# Patient Record
Sex: Female | Born: 2005
Health system: Southern US, Community
[De-identification: ages and names within clinical notes are randomized; demographics above are authoritative.]

## PROBLEM LIST (undated history)

## (undated) DIAGNOSIS — R636 Underweight: Secondary | ICD-10-CM

## (undated) DIAGNOSIS — F988 Other specified behavioral and emotional disorders with onset usually occurring in childhood and adolescence: Secondary | ICD-10-CM

## (undated) HISTORY — DX: Underweight: R63.6

## (undated) HISTORY — DX: Other specified behavioral and emotional disorders with onset usually occurring in childhood and adolescence: F98.8

## (undated) HISTORY — PX: MULTIPLE TOOTH EXTRACTIONS: SHX2053

---
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
## 2023-04-02 ENCOUNTER — Other Ambulatory Visit (HOSPITAL_BASED_OUTPATIENT_CLINIC_OR_DEPARTMENT_OTHER): Payer: Self-pay | Admitting: Obstetrics & Gynecology

## 2023-04-02 DIAGNOSIS — N926 Irregular menstruation, unspecified: Secondary | ICD-10-CM

## 2023-05-31 ENCOUNTER — Other Ambulatory Visit (HOSPITAL_BASED_OUTPATIENT_CLINIC_OR_DEPARTMENT_OTHER): Payer: Self-pay

## 2023-06-11 DIAGNOSIS — Z713 Dietary counseling and surveillance: Secondary | ICD-10-CM | POA: Diagnosis not present

## 2023-06-11 DIAGNOSIS — Z0001 Encounter for general adult medical examination with abnormal findings: Secondary | ICD-10-CM | POA: Diagnosis not present

## 2023-06-11 DIAGNOSIS — Z68.41 Body mass index (BMI) pediatric, 5th percentile to less than 85th percentile for age: Secondary | ICD-10-CM | POA: Diagnosis not present

## 2023-06-11 DIAGNOSIS — Z0000 Encounter for general adult medical examination without abnormal findings: Secondary | ICD-10-CM | POA: Diagnosis not present

## 2023-06-11 DIAGNOSIS — Z1331 Encounter for screening for depression: Secondary | ICD-10-CM | POA: Diagnosis not present

## 2023-06-11 DIAGNOSIS — Z113 Encounter for screening for infections with a predominantly sexual mode of transmission: Secondary | ICD-10-CM | POA: Diagnosis not present

## 2023-06-13 ENCOUNTER — Telehealth (INDEPENDENT_AMBULATORY_CARE_PROVIDER_SITE_OTHER): Payer: BC Managed Care – PPO | Admitting: Medical

## 2023-06-13 ENCOUNTER — Encounter (HOSPITAL_BASED_OUTPATIENT_CLINIC_OR_DEPARTMENT_OTHER): Payer: Self-pay | Admitting: Medical

## 2023-06-13 VITALS — BP 101/61 | HR 79 | Ht 62.0 in | Wt 109.0 lb

## 2023-06-13 DIAGNOSIS — Z3009 Encounter for other general counseling and advice on contraception: Secondary | ICD-10-CM

## 2023-06-13 MED ORDER — NORETHIN ACE-ETH ESTRAD-FE 1-20 MG-MCG(24) PO TABS: 1.0000 | ORAL_TABLET | Freq: Every day | ORAL | 11 refills | Status: DC

## 2023-06-13 NOTE — Progress Notes (Signed)
I connected with Stacey Cameron on 06/13/23 at 2:15 PM by: MyChart video and verified that I am speaking with the correct person using two identifiers.  Patient is located at home and provider is located at [facility].     The purpose of this virtual visit is to provide medical care while limiting exposure to the novel coronavirus. I discussed the limitations, risks, security and privacy concerns of performing an evaluation and management service by MyChart video and the availability of in person appointments. I also discussed with the patient that there may be a patient responsible charge related to this service. By engaging in this virtual visit, you consent to the provision of healthcare.  Additionally, you authorize for your insurance to be billed for the services provided during this visit.  The patient expressed understanding and agreed to proceed.   History:  Stacey Cameron is a 17 y.o. G0P0000 is being seen virtually today requesting to change her birth control. The patient was on OCPs for birth control initially and struggled to take them on schedule, she changed to the patch and has noted significant irritation at the site of administration and rash. She would like to change back to OCPs. She tolerated the OCPs well. LMP 05/07/23, she is mid-cycle on the patch.   The following portions of the patient's history were reviewed and updated as appropriate: allergies, current medications, family history, past medical history, social history, past surgical history and problem list.  Review of Systems:  Review of Systems  Gastrointestinal:  Negative for abdominal pain.  Genitourinary:        Neg  vaginal bleeding      Objective:  Physical Exam BP 101/61 (BP Location: Left Arm, Patient Position: Sitting, Cuff Size: Normal)   Pulse 79   Ht 5\' 2"  (1.575 m) Comment: Reported  Wt 109 lb (49.4 kg) Comment: Reported  LMP 05/30/2023 (Approximate)   BMI 19.94 kg/m  Physical Exam Constitutional:       General: She is not in acute distress.    Appearance: Normal appearance. She is normal weight.  Pulmonary:     Effort: Pulmonary effort is normal.  Skin:    General: Skin is warm and dry.     Coloration: Skin is not pale.  Neurological:     Mental Status: She is alert and oriented to person, place, and time.  Psychiatric:        Mood and Affect: Mood normal.     Health Maintenance Due  Topic Date Due   DTaP/Tdap/Td (1 - Tdap) Never done   HPV VACCINES (1 - 3-dose series) Never done   HIV Screening  Never done   COVID-19 Vaccine (1 - 2023-24 season) Never done   Hepatitis C Screening  Never done   INFLUENZA VACCINE  06/06/2023     Assessment & Plan:  1. Unwanted fertility - Patient counseled to start OCPs on the first Sunday following her next period to avoid breakthrough bleeding or lapse in coverage - Norethindrone Acetate-Ethinyl Estrad-FE (LOESTRIN 24 FE) 1-20 MG-MCG(24) tablet; Take 1 tablet by mouth daily.  Dispense: 28 tablet; Refill: 11 - Patient may call the office if a 90 day supply is better since she is away at college, patient and mother counseled that Rx can be transferred through [facility] if needed to one local to her college as well.    Return in about 1 year (around 06/12/2024) for Annual exam.  Marny Lowenstein, PA-C 06/13/2023 2:15 PM

## 2023-07-25 ENCOUNTER — Other Ambulatory Visit (HOSPITAL_BASED_OUTPATIENT_CLINIC_OR_DEPARTMENT_OTHER): Payer: Self-pay

## 2023-07-26 ENCOUNTER — Other Ambulatory Visit (HOSPITAL_BASED_OUTPATIENT_CLINIC_OR_DEPARTMENT_OTHER): Payer: Self-pay

## 2023-07-26 MED ORDER — AMPHETAMINE-DEXTROAMPHET ER 25 MG PO CP24
25.0000 mg | ORAL_CAPSULE | Freq: Every day | ORAL | 0 refills | Status: DC
  Filled 2023-07-26: qty 30, 30d supply, fill #0

## 2023-08-03 ENCOUNTER — Other Ambulatory Visit (HOSPITAL_BASED_OUTPATIENT_CLINIC_OR_DEPARTMENT_OTHER): Payer: Self-pay | Admitting: Obstetrics & Gynecology

## 2023-08-03 DIAGNOSIS — N926 Irregular menstruation, unspecified: Secondary | ICD-10-CM

## 2023-09-11 ENCOUNTER — Other Ambulatory Visit: Payer: Self-pay

## 2023-09-11 ENCOUNTER — Other Ambulatory Visit (HOSPITAL_BASED_OUTPATIENT_CLINIC_OR_DEPARTMENT_OTHER): Payer: Self-pay

## 2023-09-11 MED ORDER — AMPHETAMINE-DEXTROAMPHET ER 25 MG PO CP24
25.0000 mg | ORAL_CAPSULE | Freq: Every day | ORAL | 0 refills | Status: DC
Start: 2023-09-11 — End: 2023-09-30
  Filled 2023-09-11: qty 30, 30d supply, fill #0

## 2023-09-11 MED ORDER — AMPHETAMINE-DEXTROAMPHETAMINE 5 MG PO TABS
5.0000 mg | ORAL_TABLET | Freq: Every day | ORAL | 0 refills | Status: DC
Start: 2023-09-11 — End: 2023-09-30
  Filled 2023-09-11: qty 30, 30d supply, fill #0

## 2023-09-12 ENCOUNTER — Other Ambulatory Visit (HOSPITAL_BASED_OUTPATIENT_CLINIC_OR_DEPARTMENT_OTHER): Payer: Self-pay

## 2023-09-30 ENCOUNTER — Other Ambulatory Visit (HOSPITAL_BASED_OUTPATIENT_CLINIC_OR_DEPARTMENT_OTHER): Payer: Self-pay

## 2023-09-30 DIAGNOSIS — F902 Attention-deficit hyperactivity disorder, combined type: Secondary | ICD-10-CM | POA: Diagnosis not present

## 2023-09-30 MED ORDER — AMPHETAMINE-DEXTROAMPHET ER 25 MG PO CP24
25.0000 mg | ORAL_CAPSULE | Freq: Every day | ORAL | 0 refills | Status: DC
Start: 2023-09-30 — End: 2024-05-22
  Filled 2023-09-30 – 2023-11-14 (×3): qty 30, 30d supply, fill #0

## 2023-09-30 MED ORDER — AMPHETAMINE-DEXTROAMPHETAMINE 5 MG PO TABS
5.0000 mg | ORAL_TABLET | Freq: Every day | ORAL | 0 refills | Status: DC
  Filled 2023-09-30 – 2023-11-14 (×3): qty 30, 30d supply, fill #0

## 2023-10-25 ENCOUNTER — Other Ambulatory Visit (HOSPITAL_BASED_OUTPATIENT_CLINIC_OR_DEPARTMENT_OTHER): Payer: Self-pay | Admitting: Certified Nurse Midwife

## 2023-10-25 ENCOUNTER — Telehealth (HOSPITAL_BASED_OUTPATIENT_CLINIC_OR_DEPARTMENT_OTHER): Payer: Self-pay | Admitting: Certified Nurse Midwife

## 2023-10-25 ENCOUNTER — Ambulatory Visit (HOSPITAL_BASED_OUTPATIENT_CLINIC_OR_DEPARTMENT_OTHER): Payer: Self-pay | Admitting: Certified Nurse Midwife

## 2023-10-25 DIAGNOSIS — Z3009 Encounter for other general counseling and advice on contraception: Secondary | ICD-10-CM

## 2023-10-25 MED ORDER — NORETHIN ACE-ETH ESTRAD-FE 1-20 MG-MCG(24) PO TABS
1.0000 | ORAL_TABLET | Freq: Every day | ORAL | 11 refills | Status: DC
Start: 2023-10-25 — End: 2024-08-28

## 2023-10-25 NOTE — Telephone Encounter (Signed)
Called patient and  left  a message to call the office to rescheduled missed appointment.

## 2023-11-14 ENCOUNTER — Other Ambulatory Visit (HOSPITAL_BASED_OUTPATIENT_CLINIC_OR_DEPARTMENT_OTHER): Payer: Self-pay

## 2023-11-14 MED ORDER — AMPHETAMINE-DEXTROAMPHET ER 25 MG PO CP24
25.0000 mg | ORAL_CAPSULE | Freq: Every day | ORAL | 0 refills | Status: DC
  Filled 2023-12-30: qty 30, 30d supply, fill #0

## 2023-11-14 MED ORDER — AMPHETAMINE-DEXTROAMPHETAMINE 5 MG PO TABS: 5.0000 mg | ORAL_TABLET | Freq: Every day | ORAL | 0 refills | Status: DC

## 2023-11-15 ENCOUNTER — Other Ambulatory Visit: Payer: Self-pay

## 2023-12-30 ENCOUNTER — Other Ambulatory Visit (HOSPITAL_BASED_OUTPATIENT_CLINIC_OR_DEPARTMENT_OTHER): Payer: Self-pay

## 2024-05-22 ENCOUNTER — Encounter: Payer: Self-pay | Admitting: Family

## 2024-05-22 ENCOUNTER — Ambulatory Visit (INDEPENDENT_AMBULATORY_CARE_PROVIDER_SITE_OTHER): Admitting: Family

## 2024-05-22 ENCOUNTER — Other Ambulatory Visit (HOSPITAL_BASED_OUTPATIENT_CLINIC_OR_DEPARTMENT_OTHER): Payer: Self-pay

## 2024-05-22 ENCOUNTER — Telehealth: Payer: Self-pay

## 2024-05-22 VITALS — BP 106/66 | HR 89 | Temp 98.1°F | Ht 62.0 in | Wt 115.5 lb

## 2024-05-22 DIAGNOSIS — F900 Attention-deficit hyperactivity disorder, predominantly inattentive type: Secondary | ICD-10-CM

## 2024-05-22 MED ORDER — AMPHETAMINE-DEXTROAMPHET ER 25 MG PO CP24: 25.0000 mg | ORAL_CAPSULE | Freq: Every morning | ORAL | 0 refills | Status: DC

## 2024-05-22 MED ORDER — AMPHETAMINE-DEXTROAMPHETAMINE 5 MG PO TABS: 5.0000 mg | ORAL_TABLET | Freq: Every day | ORAL | 0 refills | Status: AC

## 2024-05-22 MED ORDER — AMPHETAMINE-DEXTROAMPHETAMINE 5 MG PO TABS
5.0000 mg | ORAL_TABLET | Freq: Every day | ORAL | 0 refills | Status: DC
  Filled 2024-05-22: qty 30, 30d supply, fill #0

## 2024-05-22 MED ORDER — AMPHETAMINE-DEXTROAMPHETAMINE 5 MG PO TABS
5.0000 mg | ORAL_TABLET | Freq: Every day | ORAL | 0 refills | Status: DC
  Filled 2024-06-30 (×2): qty 30, 30d supply, fill #0

## 2024-05-22 MED ORDER — AMPHETAMINE-DEXTROAMPHET ER 25 MG PO CP24
25.0000 mg | ORAL_CAPSULE | ORAL | 0 refills | Status: DC
  Filled 2024-05-22: qty 30, 30d supply, fill #0

## 2024-05-22 MED ORDER — AMPHETAMINE-DEXTROAMPHETAMINE 5 MG PO TABS: 5.0000 mg | ORAL_TABLET | Freq: Every day | ORAL | 0 refills | Status: DC

## 2024-05-22 MED ORDER — AMPHETAMINE-DEXTROAMPHET ER 25 MG PO CP24
25.0000 mg | ORAL_CAPSULE | Freq: Every morning | ORAL | 0 refills | Status: DC
  Filled 2024-06-30: qty 30, 30d supply, fill #0

## 2024-05-22 MED ORDER — AMPHETAMINE-DEXTROAMPHET ER 25 MG PO CP24
25.0000 mg | ORAL_CAPSULE | ORAL | 0 refills | Status: DC
Start: 2024-05-22 — End: 2024-05-22

## 2024-05-22 NOTE — Telephone Encounter (Signed)
 Copied from CRM [id number]. Topic: Clinical - Prescription Issue >> May 22, 2024  9:19 AM Viola FALCON wrote: Reason for CRM: Patient was seen today - mother Kate call and needs both Adderall prescriptions resent to the [facility] [address] [place] [address]

## 2024-05-22 NOTE — Telephone Encounter (Signed)
 done

## 2024-05-22 NOTE — Telephone Encounter (Signed)
 I called pt and LVM in regards.

## 2024-05-22 NOTE — Patient Instructions (Addendum)
 Welcome to [facility], It was a pleasure meeting you today!    As discussed, I have sent your refills to your pharmacy.  Please schedule a 3 month follow up visit today, ok if virtual.    PLEASE NOTE: If you had any LAB tests please let us  know if you have not heard back within a few days. You may see your results on MyChart before we have a chance to review them but we will give you a call once they are reviewed by us . If we ordered any REFERRALS today, please let us  know if you have not heard from their office within the next week.  Let us  know through MyChart if you are needing REFILLS, or have your pharmacy send us  the request. You can also use MyChart to communicate with me or any office staff.  Please try these tips to maintain a healthy lifestyle: It is important that you exercise regularly at least 30 minutes 5 times a week. Think about what you will eat, plan ahead. Choose whole foods, & think  clean, green, fresh or frozen over canned, processed or packaged foods which are more sugary, salty, and fatty. 70 to 75% of food eaten should be fresh vegetables and protein. 2-3  meals daily with healthy snacks between meals, but must be whole fruit, protein or vegetables. Aim to eat over a 10 hour period when you are active, for example, 7am to 5pm, and then STOP after your last meal of the day, drinking only water.  Shorter eating windows, 6-8 hours, are showing benefits in heart disease and blood sugar regulation. Drink water every day! Shoot for 64 ounces daily = 8 cups, no other drink is as healthy! Fruit juice is best enjoyed in a healthy way, by EATING the fruit.

## 2024-05-22 NOTE — Assessment & Plan Note (Signed)
 Taking Adderall XR 25mg  qam and 5mg  IR dose after lunch. Has been on this regimen since middle school. Denies any SE and dose if working well for her. - Sending refills of both meds for 30 pills x 30d x 3 mos - Follow up in 3 mos, ok if virtual

## 2024-05-22 NOTE — Addendum Note (Signed)
 Addended by: Miski Feldpausch on: 05/22/2024 03:48 PM   Modules accepted: Orders

## 2024-05-22 NOTE — Progress Notes (Signed)
 New Patient Office Visit  Subjective:  Patient ID: Stacey Cameron, female    DOB: September 26, 2006  Age: 18 y.o. MRN: [id number]  CC:  Chief Complaint  Patient presents with   New Patient (Initial Visit)   ADHD   HPI Stacey Cameron presents for establishing care today.  ADHD f/u: Medications helping target goals: generic Adderall XR 25mg  and 5mg  IR Regimen: daily Medication side effects/concerns:  none Weight: wnl Sleep: no issues Mood changes: none Tics: denies Blood pressure, Weight, Pulse, Behavior reviewed: wnl      Assessment & Plan:  Attention deficit hyperactivity disorder (ADHD), predominantly inattentive type Assessment & Plan: Taking Adderall XR 25mg  qam and 5mg  IR dose after lunch. Has been on this regimen since middle school. Denies any SE and dose if working well for her. - Sending refills of both meds for 30 pills x 30d x 3 mos - Follow up in 3 mos, ok if virtual  Orders: -     Amphetamine -Dextroamphet ER; Take 1 capsule by mouth every morning.  Dispense: 30 capsule; Refill: 0 -     Amphetamine -Dextroamphet ER; Take 1 capsule by mouth every morning.  Dispense: 30 capsule; Refill: 0 -     Amphetamine -Dextroamphet ER; Take 1 capsule by mouth every morning.  Dispense: 30 capsule; Refill: 0 -     Amphetamine -Dextroamphetamine ; Take 1 tablet (5 mg total) by mouth daily after lunch.  Dispense: 30 tablet; Refill: 0 -     Amphetamine -Dextroamphetamine ; Take 1 tablet (5 mg total) by mouth daily after lunch.  Dispense: 30 tablet; Refill: 0 -     Amphetamine -Dextroamphetamine ; Take 1 tablet (5 mg total) by mouth daily after lunch.  Dispense: 30 tablet; Refill: 0    Subjective:    Outpatient Medications Prior to Visit  Medication Sig Dispense Refill   amphetamine -dextroamphetamine  (ADDERALL) 5 MG tablet Take 1 tablet (5 mg total) by mouth daily in the afternoon if needed 30 tablet 0   amphetamine -dextroamphetamine  (ADDERALL) 5 MG tablet Take 1 tablet (5 mg total) by mouth  daily in the afternoon if needed. 30 tablet 0   Norethindrone Acetate-Ethinyl Estrad-FE (LOESTRIN 24 FE) 1-20 MG-MCG(24) tablet Take 1 tablet by mouth daily. 90 tablet 11   amphetamine -dextroamphetamine  (ADDERALL XR) 20 MG 24 hr capsule Take 20 mg by mouth daily.     amphetamine -dextroamphetamine  (ADDERALL XR) 25 MG 24 hr capsule Take 1 capsule by mouth daily. 30 capsule 0   amphetamine -dextroamphetamine  (ADDERALL XR) 25 MG 24 hr capsule Take 1 capsule by mouth daily. 30 capsule 0   cyproheptadine (PERIACTIN) 2 MG/5ML syrup TAKE 10 MLS BY MOUTH DAILY (Patient not taking: Reported on 05/22/2024) 600 mL 3   norelgestromin -ethinyl estradiol  (XULANE) 150-35 MCG/24HR transdermal patch APPLY 1 PATCH ONCE A WEEK (Patient not taking: Reported on 05/22/2024) 9 patch 1   No facility-administered medications prior to visit.   Past Medical History:  Diagnosis Date   ADD (attention deficit disorder)    Underweight    Past Surgical History:  Procedure Laterality Date   MULTIPLE TOOTH EXTRACTIONS      Objective:   Today's Vitals: BP 106/66 (BP Location: Left Arm, Patient Position: Sitting, Cuff Size: Large)   Pulse 89   Temp 98.1 F (36.7 C) (Temporal)   Ht 5' 2 (1.575 m)   Wt 115 lb 8 oz (52.4 kg)   LMP 04/12/2024 (Exact Date)   BMI 21.13 kg/m   Physical Exam Vitals and nursing note reviewed.  Constitutional:  Appearance: Normal appearance.  Cardiovascular:     Rate and Rhythm: Normal rate and regular rhythm.  Pulmonary:     Effort: Pulmonary effort is normal.     Breath sounds: Normal breath sounds.  Musculoskeletal:        General: Normal range of motion.  Skin:    General: Skin is warm and dry.  Neurological:     Mental Status: She is alert.  Psychiatric:        Mood and Affect: Mood normal.        Behavior: Behavior normal.     Meds ordered this encounter  Medications   amphetamine -dextroamphetamine  (ADDERALL XR) 25 MG 24 hr capsule    Sig: Take 1 capsule by mouth  every morning.    Dispense:  30 capsule    Refill:  0    Dec RX   amphetamine -dextroamphetamine  (ADDERALL XR) 25 MG 24 hr capsule    Sig: Take 1 capsule by mouth every morning.    Dispense:  30 capsule    Refill:  0   amphetamine -dextroamphetamine  (ADDERALL XR) 25 MG 24 hr capsule    Sig: Take 1 capsule by mouth every morning.    Dispense:  30 capsule    Refill:  0    Supervising Provider:   ANDY, CAMILLE L [2031]   amphetamine -dextroamphetamine  (ADDERALL) 5 MG tablet    Sig: Take 1 tablet (5 mg total) by mouth daily after lunch.    Dispense:  30 tablet    Refill:  0    Supervising Provider:   ANDY, CAMILLE L [2031]   amphetamine -dextroamphetamine  (ADDERALL) 5 MG tablet    Sig: Take 1 tablet (5 mg total) by mouth daily after lunch.    Dispense:  30 tablet    Refill:  0    Supervising Provider:   ANDY, CAMILLE L [2031]   amphetamine -dextroamphetamine  (ADDERALL) 5 MG tablet    Sig: Take 1 tablet (5 mg total) by mouth daily after lunch.    Dispense:  30 tablet    Refill:  0    Supervising Provider:   ANDY, CAMILLE L [2031]    Lucius Krabbe, NP

## 2024-05-25 ENCOUNTER — Other Ambulatory Visit (HOSPITAL_BASED_OUTPATIENT_CLINIC_OR_DEPARTMENT_OTHER): Payer: Self-pay

## 2024-06-30 ENCOUNTER — Other Ambulatory Visit (HOSPITAL_BASED_OUTPATIENT_CLINIC_OR_DEPARTMENT_OTHER): Payer: Self-pay

## 2024-08-18 ENCOUNTER — Other Ambulatory Visit (HOSPITAL_BASED_OUTPATIENT_CLINIC_OR_DEPARTMENT_OTHER): Payer: Self-pay

## 2024-08-28 ENCOUNTER — Telehealth: Admitting: Family

## 2024-08-28 ENCOUNTER — Encounter: Payer: Self-pay | Admitting: Family

## 2024-08-28 ENCOUNTER — Other Ambulatory Visit (HOSPITAL_BASED_OUTPATIENT_CLINIC_OR_DEPARTMENT_OTHER): Payer: Self-pay

## 2024-08-28 DIAGNOSIS — N926 Irregular menstruation, unspecified: Secondary | ICD-10-CM | POA: Diagnosis not present

## 2024-08-28 DIAGNOSIS — F900 Attention-deficit hyperactivity disorder, predominantly inattentive type: Secondary | ICD-10-CM

## 2024-08-28 DIAGNOSIS — Z3041 Encounter for surveillance of contraceptive pills: Secondary | ICD-10-CM | POA: Diagnosis not present

## 2024-08-28 MED ORDER — AMPHETAMINE-DEXTROAMPHETAMINE 5 MG PO TABS: 5.0000 mg | ORAL_TABLET | Freq: Every day | ORAL | 0 refills | Status: AC

## 2024-08-28 MED ORDER — AMPHETAMINE-DEXTROAMPHET ER 25 MG PO CP24: 25.0000 mg | ORAL_CAPSULE | ORAL | 0 refills | Status: AC

## 2024-08-28 MED ORDER — AMPHETAMINE-DEXTROAMPHET ER 25 MG PO CP24
25.0000 mg | ORAL_CAPSULE | Freq: Every morning | ORAL | 0 refills | Status: AC
  Filled 2024-08-28 – 2024-09-08 (×2): qty 30, 30d supply, fill #0

## 2024-08-28 MED ORDER — AMPHETAMINE-DEXTROAMPHETAMINE 5 MG PO TABS
5.0000 mg | ORAL_TABLET | Freq: Every day | ORAL | 0 refills | Status: AC
  Filled 2024-08-28: qty 30, 30d supply, fill #0

## 2024-08-28 MED ORDER — AMPHETAMINE-DEXTROAMPHET ER 25 MG PO CP24: 25.0000 mg | ORAL_CAPSULE | Freq: Every morning | ORAL | 0 refills | Status: AC

## 2024-08-28 MED ORDER — HAILEY 24 FE 1-20 MG-MCG(24) PO TABS
1.0000 | ORAL_TABLET | Freq: Every day | ORAL | 3 refills | Status: AC
  Filled 2024-08-28 – 2024-09-08 (×2): qty 84, 84d supply, fill #0

## 2024-08-28 NOTE — Progress Notes (Signed)
 MyChart Video Visit    Virtual Visit via Video Note   This format is felt to be most appropriate for this patient at this time. Physical exam was limited by quality of the video and audio technology used for the visit. CMA was able to get the patient set up on a video visit.  Patient location: Home. Patient and provider in visit Provider location: Office  I discussed the limitations of evaluation and management by telemedicine and the availability of in person appointments. The patient expressed understanding and agreed to proceed.  Visit Date: 08/28/2024  Today's healthcare provider: Lucius Krabbe, NP     Subjective:   Patient ID: Stacey Cameron, female    DOB: [birth year], 18 y.o.   MRN: [id number]  Chief Complaint  Patient presents with   ADHD  Discussed the use of AI scribe software for clinical note transcription with the patient, who gave verbal consent to proceed.  History of Present Illness Stacey Cameron is a 18 year old female with ADHD who presents for a follow-up visit.  She takes Adderall 25 mg extended release every morning and 5 mg immediate release in the afternoons, though she occasionally skips the afternoon dose and sometimes the extended release on weekends. She experiences no side effects such as tics, anxiety, or increased heart rate. Her sleep and eating habits are stable. Her blood pressure, measured by her roommate, was initially 98/52 mmHg with crossed legs, later 112/70 mmHg. She experiences no palpitations or unusual anxiety. She takes Darryle with iron daily for birth control and requests a refill.  Assessment & Plan Attention-deficit hyperactivity disorder, predominantly inattentive type ADHD managed with Adderall. No side effects reported. Sleeping and eating well. - Continue Adderall 25 mg extended release every morning. - Continue Adderall 5 mg immediate release in the afternoons as needed. - Monitor blood pressure and heart rate regularly. -  Instruct to report palpitations, racing heart, or increased anxiety. - Send 30 day pills with 3 refills to pharmacy. - Schedule follow-up in 3-4 months in the office or when refills needed.  Encounter for birth control maintenance Taking Darryle with iron daily. No interactions with Adderall except potential increased blood pressure and heart rate. - Send refill for [facility] with iron to pharmacy for 90d w/3 RF   Past Medical History:  Diagnosis Date   ADD (attention deficit disorder)    Underweight     Past Surgical History:  Procedure Laterality Date   MULTIPLE TOOTH EXTRACTIONS      Outpatient Medications Prior to Visit  Medication Sig Dispense Refill   amphetamine -dextroamphetamine  (ADDERALL XR) 25 MG 24 hr capsule Take 1 capsule by mouth every morning. 30 capsule 0   amphetamine -dextroamphetamine  (ADDERALL XR) 25 MG 24 hr capsule Take 1 capsule by mouth every morning. 30 capsule 0   amphetamine -dextroamphetamine  (ADDERALL XR) 25 MG 24 hr capsule Take 1 capsule by mouth every morning. 30 capsule 0   amphetamine -dextroamphetamine  (ADDERALL) 5 MG tablet Take 1 tablet (5 mg total) by mouth daily in the afternoon if needed 30 tablet 0   amphetamine -dextroamphetamine  (ADDERALL) 5 MG tablet Take 1 tablet (5 mg total) by mouth daily in the afternoon if needed. 30 tablet 0   amphetamine -dextroamphetamine  (ADDERALL) 5 MG tablet Take 1 tablet (5 mg total) by mouth daily after lunch. 30 tablet 0   amphetamine -dextroamphetamine  (ADDERALL) 5 MG tablet Take 1 tablet (5 mg total) by mouth daily after lunch. 30 tablet 0   amphetamine -dextroamphetamine  (ADDERALL) 5 MG tablet  Take 1 tablet (5 mg total) by mouth daily after lunch. 30 tablet 0   Norethindrone Acetate-Ethinyl Estrad-FE (LOESTRIN 24 FE) 1-20 MG-MCG(24) tablet Take 1 tablet by mouth daily. 90 tablet 11   No facility-administered medications prior to visit.    Allergies  Allergen Reactions   Other Anaphylaxis    cats        Objective:   Physical Exam Vitals and nursing note reviewed.  Constitutional:      General: Pt is not in acute distress.    Appearance: Normal appearance.  HENT:     Head: Normocephalic.  Pulmonary:     Effort: No respiratory distress.  Musculoskeletal:     Cervical back: Normal range of motion.  Skin:    General: Skin is dry.     Coloration: Skin is not pale.  Neurological:     Mental Status: Pt is alert and oriented to person, place, and time.  Psychiatric:        Mood and Affect: Mood normal.   There were no vitals taken for this visit.  Wt Readings from Last 3 Encounters:  05/22/24 115 lb 8 oz (52.4 kg) (25%, Z= -0.67)*  06/13/23 109 lb (49.4 kg) (16%, Z= -1.01)*  09/26/22 107 lb (48.5 kg) (14%, Z= -1.06)*   * Growth percentiles are based on CDC (Girls, 2-20 Years) data.     I discussed the assessment and treatment plan with the patient. The patient was provided an opportunity to ask questions and all were answered. The patient agreed with the plan and demonstrated an understanding of the instructions.   The patient was advised to call back or seek an in-person evaluation if the symptoms worsen or if the condition fails to improve as anticipated.  Lucius Krabbe, NP [facility] [phone] (phone) [phone] (fax)  [facility]

## 2024-08-29 ENCOUNTER — Other Ambulatory Visit (HOSPITAL_BASED_OUTPATIENT_CLINIC_OR_DEPARTMENT_OTHER): Payer: Self-pay

## 2024-09-07 ENCOUNTER — Other Ambulatory Visit (HOSPITAL_BASED_OUTPATIENT_CLINIC_OR_DEPARTMENT_OTHER): Payer: Self-pay

## 2024-09-08 ENCOUNTER — Other Ambulatory Visit (HOSPITAL_BASED_OUTPATIENT_CLINIC_OR_DEPARTMENT_OTHER): Payer: Self-pay
